# Patient Record
Sex: Male | Born: 1990 | Race: Black or African American | Hispanic: No | Marital: Single | Smoking: Never smoker
Health system: Southern US, Community
[De-identification: ages and names within clinical notes are randomized; demographics above are authoritative.]

---

## 2013-11-14 ENCOUNTER — Ambulatory Visit: Payer: Managed Care, Other (non HMO)

## 2013-11-14 ENCOUNTER — Ambulatory Visit (INDEPENDENT_AMBULATORY_CARE_PROVIDER_SITE_OTHER): Payer: Managed Care, Other (non HMO) | Admitting: Family Medicine

## 2013-11-14 VITALS — BP 100/60 | HR 64 | Temp 98.4°F | Resp 16 | Ht 67.0 in | Wt 169.0 lb

## 2013-11-14 DIAGNOSIS — M545 Low back pain, unspecified: Secondary | ICD-10-CM

## 2013-11-14 DIAGNOSIS — M25579 Pain in unspecified ankle and joints of unspecified foot: Secondary | ICD-10-CM

## 2013-11-14 DIAGNOSIS — S93409A Sprain of unspecified ligament of unspecified ankle, initial encounter: Secondary | ICD-10-CM

## 2013-11-14 DIAGNOSIS — M25571 Pain in right ankle and joints of right foot: Secondary | ICD-10-CM

## 2013-11-14 DIAGNOSIS — S39012A Strain of muscle, fascia and tendon of lower back, initial encounter: Secondary | ICD-10-CM

## 2013-11-14 DIAGNOSIS — S93401A Sprain of unspecified ligament of right ankle, initial encounter: Secondary | ICD-10-CM

## 2013-11-14 DIAGNOSIS — S335XXA Sprain of ligaments of lumbar spine, initial encounter: Secondary | ICD-10-CM

## 2013-11-14 NOTE — Progress Notes (Addendum)
Subjective:   This chart was scribed for Kenneth ChickKristi M Smith, MD by Arlan OrganAshley Leger, [facility] Sc[facility]. This patient was seen in room 1 and the patient's care was started 5:50 PM.     Patient ID: Kenneth Ruiz, male    DOB: [birth year], 23 y.o.   MRN: [id number]  11/14/2013  Ankle Pain and Back Pain   HPI  HPI Comments: Kenneth MaidensKeith Study Ruiz is a 23 y.o. male who presents to [facility] complaining of constant intermittent lateral right ankle pain x 1 day. Pt states he rolled his ankle while walking today. After injury, pt states he wrapped his ankle and elevated with ice. At this time he denies any numbness or tingling of the lower extremity. He reports an injury to the same ankle about 1 year ago. Denies any known allergies at this time.  Has had constant intermittent ankle pain for the past year.  He also reports intermittent back spasms that is intense enough to stop flexion of the back. He states this is not a new problem, and states it started about 1 year ago. He reports 5 episodes of pain since onset. States he stretched about 2-5 times a week to prevent pain.  Review of Systems  Constitutional: Negative for fever, chills, diaphoresis and fatigue.  HENT: Negative for congestion.   Eyes: Negative for redness.  Respiratory: Negative for cough.   Gastrointestinal: Negative for nausea and vomiting.  Genitourinary: Negative for dysuria, urgency, frequency and hematuria.  Musculoskeletal: Positive for myalgias, back pain, joint swelling, arthralgias and gait problem.  Skin: Negative for rash.  Neurological: Negative for weakness and numbness.  Psychiatric/Behavioral: Negative for confusion.    History reviewed. No pertinent past medical history. No Known Allergies No current outpatient prescriptions on file.   No current facility-administered medications for this visit.       Objective:    BP 100/60 mmHg  Pulse 64  Temp(Src) 98.4 F (36.9  C) (Oral)  Resp 16  Ht 5\' 7"  (1.702 m)  Wt 169 lb (76.658 kg)  BMI 26.46 kg/m2  SpO2 97% Physical Exam  Constitutional: He is oriented to person, place, and time. He appears well-developed and well-nourished.  HENT:  Head: Normocephalic and atraumatic.  Eyes: EOM are normal.  Neck: Normal range of motion.  Cardiovascular: Normal rate.   Pulmonary/Chest: Effort normal.  Musculoskeletal: He exhibits tenderness.       Right ankle: He exhibits decreased range of motion. He exhibits no swelling, no ecchymosis, no deformity and normal pulse. Tenderness. No lateral malleolus, no medial malleolus, no AITFL, no CF ligament, no posterior TFL, no head of 5th metatarsal and no proximal fibula tenderness found. Achilles tendon normal.       Lumbar back: He exhibits pain and spasm. He exhibits normal range of motion, no tenderness and no bony tenderness.       Right foot: There is tenderness and bony tenderness. There is normal range of motion, no swelling, normal capillary refill, no crepitus, no deformity and no laceration.  Tenderness to palpation over proximal lateral foot No pain over 5th of 4th metatarsal  Neurological: He is alert and oriented to person, place, and time.  Skin: Skin is warm and dry.  Psychiatric: He has a normal mood and affect. His behavior is normal.  Nursing note and vitals reviewed.  No results found for this or any previous visit.  [facility] reading (PRIMARY) by  Dr. Katrinka BlazingSmith.  R ANKLE: NAD;  LUMBAR SPINE: NAD      Assessment & Plan:  Pain in right ankle - Plan: DG Ankle Complete Right  Low back pain - Plan: DG Lumbar Spine 2-3 Views  Sprain of right ankle  Lumbar strain   1. Pain/sprain R lateral ankle: New. Recommend rest, ice, elevation, scheduled NSAIDs for one week.   If no improvement in one week, RTC. 2.  Low back pain/strain: New. Recommend rest, heat, daily home exercises, frequent ambulation.  Recommend scheduled NSAID for two weeks. If no improvement in one  month, call for ortho referral.  No orders of the defined types were placed in this encounter.    No Follow-up on file.  I personally performed the services described in this documentation, which was scribed in my presence.  The recorded information has been reviewed and is accurate.  Nilda Simmer, M.D.  [facility] [address] [place], [place]  [address] [phone] phone [phone] fax

## 2013-11-14 NOTE — Patient Instructions (Signed)
Acute Ankle Sprain  with Phase I Rehab  An acute ankle sprain is a partial or complete tear in one or more of the ligaments of the ankle due to traumatic injury. The severity of the injury depends on both the the number of ligaments sprained and the grade of sprain. There are 3 grades of sprains.   · A grade 1 sprain is a mild sprain. There is a slight pull without obvious tearing. There is no loss of strength, and the muscle and ligament are the correct length.  · A grade 2 sprain is a moderate sprain. There is tearing of fibers within the substance of the ligament where it connects two bones or two cartilages. The length of the ligament is increased, and there is usually decreased strength.  · A grade 3 sprain is a complete rupture of the ligament and is uncommon.  In addition to the grade of sprain, there are three types of ankle sprains.   Lateral ankle sprains: This is a sprain of one or more of the three ligaments on the outer side (lateral) of the ankle. These are the most common sprains.  Medial ankle sprains: There is one large triangular ligament of the inner side (medial) of the ankle that is susceptible to injury. Medial ankle sprains are less common.  Syndesmosis, "high ankle," sprains: The syndesmosis is the ligament that connects the two bones of the lower leg. Syndesmosis sprains usually only occur with very severe ankle sprains.  SYMPTOMS  · Pain, tenderness, and swelling in the ankle, starting at the side of injury that may progress to the whole ankle and foot with time.  · "Pop" or tearing sensation at the time of injury.  · Bruising that may spread to the heel.  · Impaired ability to walk soon after injury.  CAUSES   · Acute ankle sprains are caused by trauma placed on the ankle that temporarily forces or pries the anklebone (talus) out of its normal socket.  · Stretching or tearing of the ligaments that normally hold the joint in place (usually due to a twisting injury).  RISK INCREASES  WITH:  · Previous ankle sprain.  · Sports in which the foot may land awkwardly (ie. basketball, volleyball, or soccer) or walking or running on uneven or rough surfaces.  · Shoes with inadequate support to prevent sideways motion when stress occurs.  · Poor strength and flexibility.  · Poor balance skills.  · Contact sports.  PREVENTION   · Warm up and stretch properly before activity.  · Maintain physical fitness:  · Ankle and leg flexibility, muscle strength, and endurance.  · Cardiovascular fitness.  · Balance training activities.  · Use proper technique and have a coach correct improper technique.  · Taping, protective strapping, bracing, or high-top tennis shoes may help prevent injury. Initially, tape is best; however, it loses most of its support function within 10 to 15 minutes.  · Wear proper fitted protective shoes (High-top shoes with taping or bracing is more effective than either alone).  · Provide the ankle with support during sports and practice activities for 12 months following injury.  PROGNOSIS   · If treated properly, ankle sprains can be expected to recover completely; however, the length of recovery depends on the degree of injury.  · A grade 1 sprain usually heals enough in 5 to 7 days to allow modified activity and requires an average of 6 weeks to heal completely.  · A grade 2 sprain requires   6 to 10 weeks to heal completely.  · A grade 3 sprain requires 12 to 16 weeks to heal.  · A syndesmosis sprain often takes more than 3 months to heal.  RELATED COMPLICATIONS   · Frequent recurrence of symptoms may result in a chronic problem. Appropriately addressing the problem the first time decreases the frequency of recurrence and optimizes healing time. Severity of the initial sprain does not predict the likelihood of later instability.  · Injury to other structures (bone, cartilage, or tendon).  · A chronically unstable or arthritic ankle joint is a possiblity with repeated  sprains.  TREATMENT  Treatment initially involves the use of ice, medication, and compression bandages to help reduce pain and inflammation. Ankle sprains are usually immobilized in a walking cast or boot to allow for healing. Crutches may be recommended to reduce pressure on the injury. After immobilization, strengthening and stretching exercises may be necessary to regain strength and a full range of motion. Surgery is rarely needed to treat ankle sprains.  MEDICATION   · Nonsteroidal anti-inflammatory medications, such as aspirin and ibuprofen (do not take for the first 3 days after injury or within 7 days before surgery), or other minor pain relievers, such as acetaminophen, are often recommended. Take these as directed by your caregiver. Contact your caregiver immediately if any bleeding, stomach upset, or signs of an allergic reaction occur from these medications.  · Ointments applied to the skin may be helpful.  · Pain relievers may be prescribed as necessary by your caregiver. Do not take prescription pain medication for longer than 4 to 7 days. Use only as directed and only as much as you need.  HEAT AND COLD  · Cold treatment (icing) is used to relieve pain and reduce inflammation for acute and chronic cases. Cold should be applied for 10 to 15 minutes every 2 to 3 hours for inflammation and pain and immediately after any activity that aggravates your symptoms. Use ice packs or an ice massage.  · Heat treatment may be used before performing stretching and strengthening activities prescribed by your caregiver. Use a heat pack or a warm soak.  SEEK IMMEDIATE MEDICAL CARE IF:   · Pain, swelling, or bruising worsens despite treatment.  · You experience pain, numbness, discoloration, or coldness in the foot or toes.  · New, unexplained symptoms develop (drugs used in treatment may produce side effects.)  EXERCISES   PHASE I EXERCISES  RANGE OF MOTION (ROM) AND STRETCHING EXERCISES - Ankle Sprain, Acute Phase I,  Weeks 1 to 2  These exercises may help you when beginning to restore flexibility in your ankle. You will likely work on these exercises for the 1 to 2 weeks after your injury. Once your physician, physical therapist, or athletic trainer sees adequate progress, he or she will advance your exercises. While completing these exercises, remember:   · Restoring tissue flexibility helps normal motion to return to the joints. This allows healthier, less painful movement and activity.  · An effective stretch should be held for at least 30 seconds.  · A stretch should never be painful. You should only feel a gentle lengthening or release in the stretched tissue.  RANGE OF MOTION - Dorsi/Plantar Flexion  · While sitting with your right / left knee straight, draw the top of your foot upwards by flexing your ankle. Then reverse the motion, pointing your toes downward.  · Hold each position for __________ seconds.  · After completing your first set of   exercises, repeat this exercise with your knee bent.  Repeat __________ times. Complete this exercise __________ times per day.   RANGE OF MOTION - Ankle Alphabet  · Imagine your right / left big toe is a pen.  · Keeping your hip and knee still, write out the entire alphabet with your "pen." Make the letters as large as you can without increasing any discomfort.  Repeat __________ times. Complete this exercise __________ times per day.   STRENGTHENING EXERCISES - Ankle Sprain, Acute -Phase I, Weeks 1 to 2  These exercises may help you when beginning to restore strength in your ankle. You will likely work on these exercises for 1 to 2 weeks after your injury. Once your physician, physical therapist, or athletic trainer sees adequate progress, he or she will advance your exercises. While completing these exercises, remember:   · Muscles can gain both the endurance and the strength needed for everyday activities through controlled exercises.  · Complete these exercises as instructed by  your physician, physical therapist, or athletic trainer. Progress the resistance and repetitions only as guided.  · You may experience muscle soreness or fatigue, but the pain or discomfort you are trying to eliminate should never worsen during these exercises. If this pain does worsen, stop and make certain you are following the directions exactly. If the pain is still present after adjustments, discontinue the exercise until you can discuss the trouble with your clinician.  STRENGTH - Dorsiflexors  · Secure a rubber exercise band/tubing to a fixed object (ie. table, pole) and loop the other end around your right / left foot.  · Sit on the floor facing the fixed object. The band/tubing should be slightly tense when your foot is relaxed.  · Slowly draw your foot back toward you using your ankle and toes.  · Hold this position for __________ seconds. Slowly release the tension in the band and return your foot to the starting position.  Repeat __________ times. Complete this exercise __________ times per day.   STRENGTH - Plantar-flexors   · Sit with your right / left leg extended. Holding onto both ends of a rubber exercise band/tubing, loop it around the ball of your foot. Keep a slight tension in the band.  · Slowly push your toes away from you, pointing them downward.  · Hold this position for __________ seconds. Return slowly, controlling the tension in the band/tubing.  Repeat __________ times. Complete this exercise __________ times per day.   STRENGTH - Ankle Eversion  · Secure one end of a rubber exercise band/tubing to a fixed object (table, pole). Loop the other end around your foot just before your toes.  · Place your fists between your knees. This will focus your strengthening at your ankle.  · Drawing the band/tubing across your opposite foot, slowly, pull your little toe out and up. Make sure the band/tubing is positioned to resist the entire motion.  · Hold this position for __________ seconds.  Have  your muscles resist the band/tubing as it slowly pulls your foot back to the starting position.   Repeat __________ times. Complete this exercise __________ times per day.   STRENGTH - Ankle Inversion  · Secure one end of a rubber exercise band/tubing to a fixed object (table, pole). Loop the other end around your foot just before your toes.  · Place your fists between your knees. This will focus your strengthening at your ankle.  · Slowly, pull your big toe up and in, making   sure the band/tubing is positioned to resist the entire motion.  · Hold this position for __________ seconds.  · Have your muscles resist the band/tubing as it slowly pulls your foot back to the starting position.  Repeat __________ times. Complete this exercises __________ times per day.   STRENGTH - Towel Curls  · Sit in a chair positioned on a non-carpeted surface.  · Place your right / left foot on a towel, keeping your heel on the floor.  · Pull the towel toward your heel by only curling your toes. Keep your heel on the floor.  · If instructed by your physician, physical therapist, or athletic trainer, add weight to the end of the towel.  Repeat __________ times. Complete this exercise __________ times per day.  Document Released: 03/17/2005 Document Revised: 11/08/2011 Document Reviewed: 11/28/2008  [employer]® Patient Information ©2014 [employer].

## 2014-10-03 IMAGING — CR DG LUMBAR SPINE 2-3V
3 series · 3 of 3 positions shown · non-contrast
Comparison: None.

CLINICAL DATA: Pain.

EXAM:
LUMBAR SPINE - 2-3 VIEW

[AP]
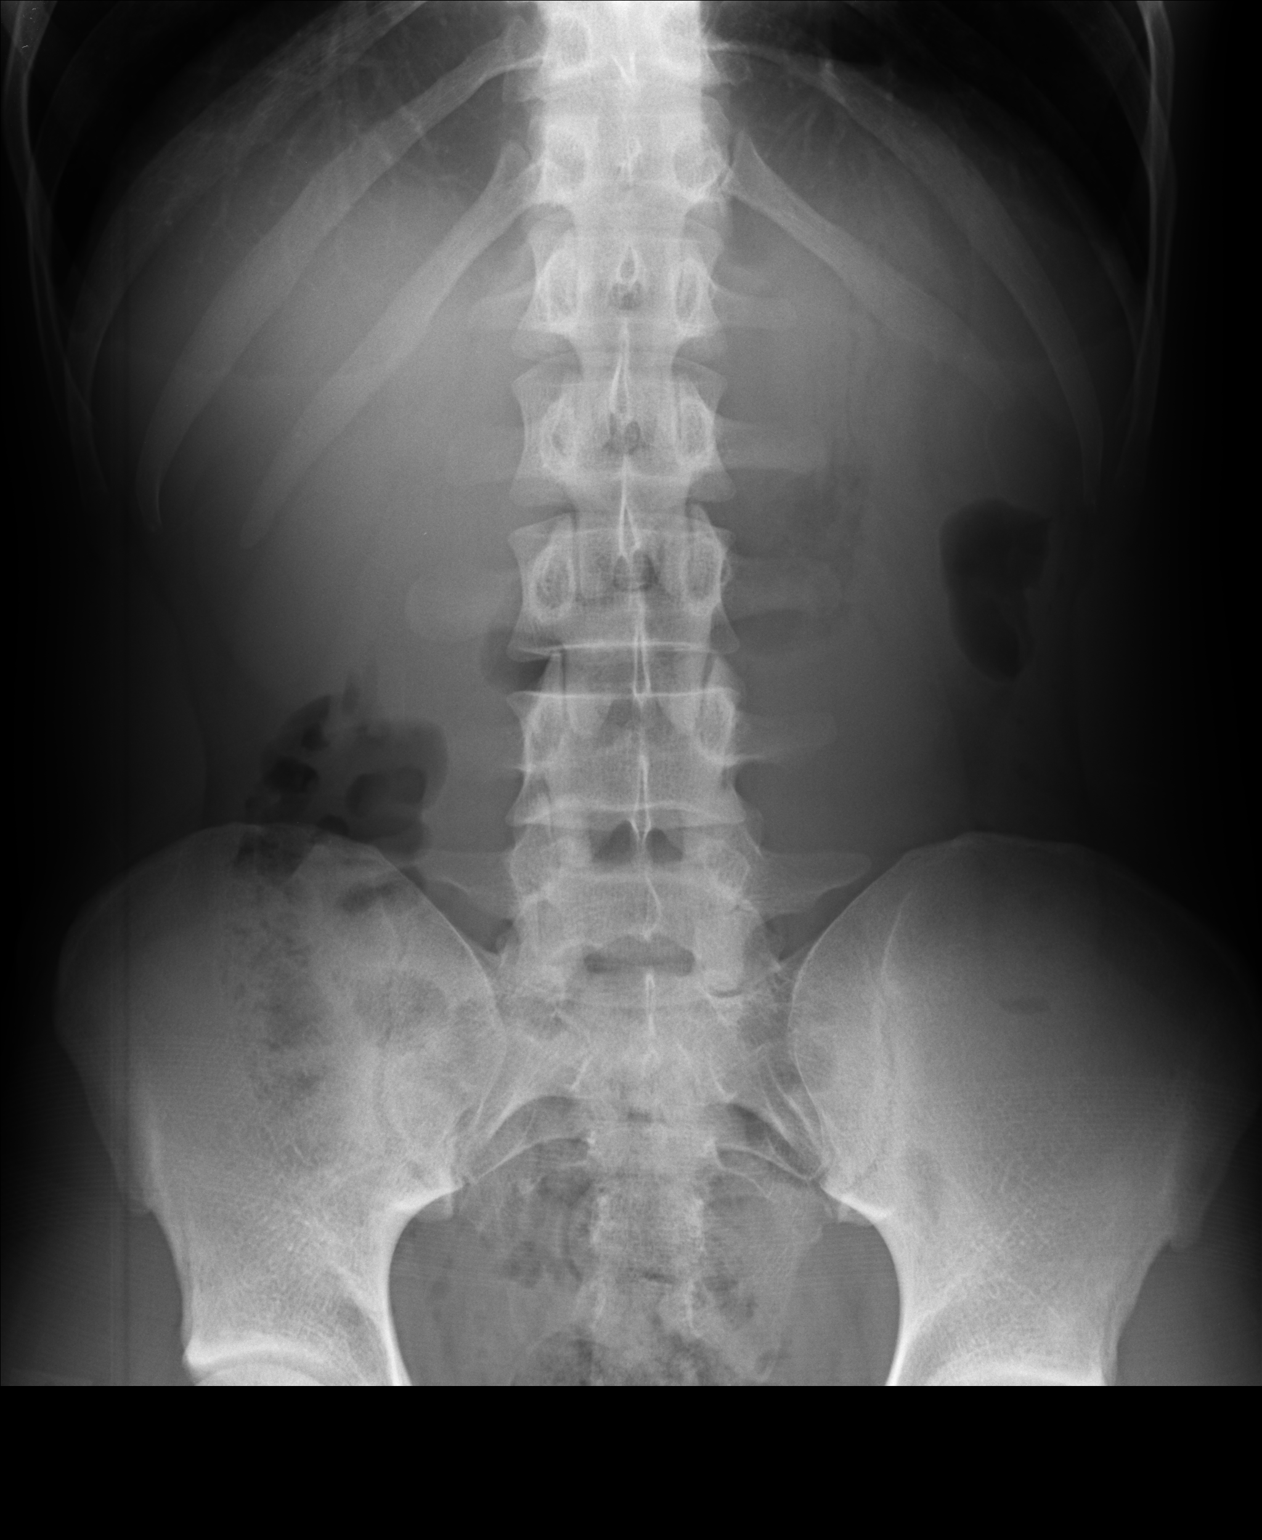

[lateral]
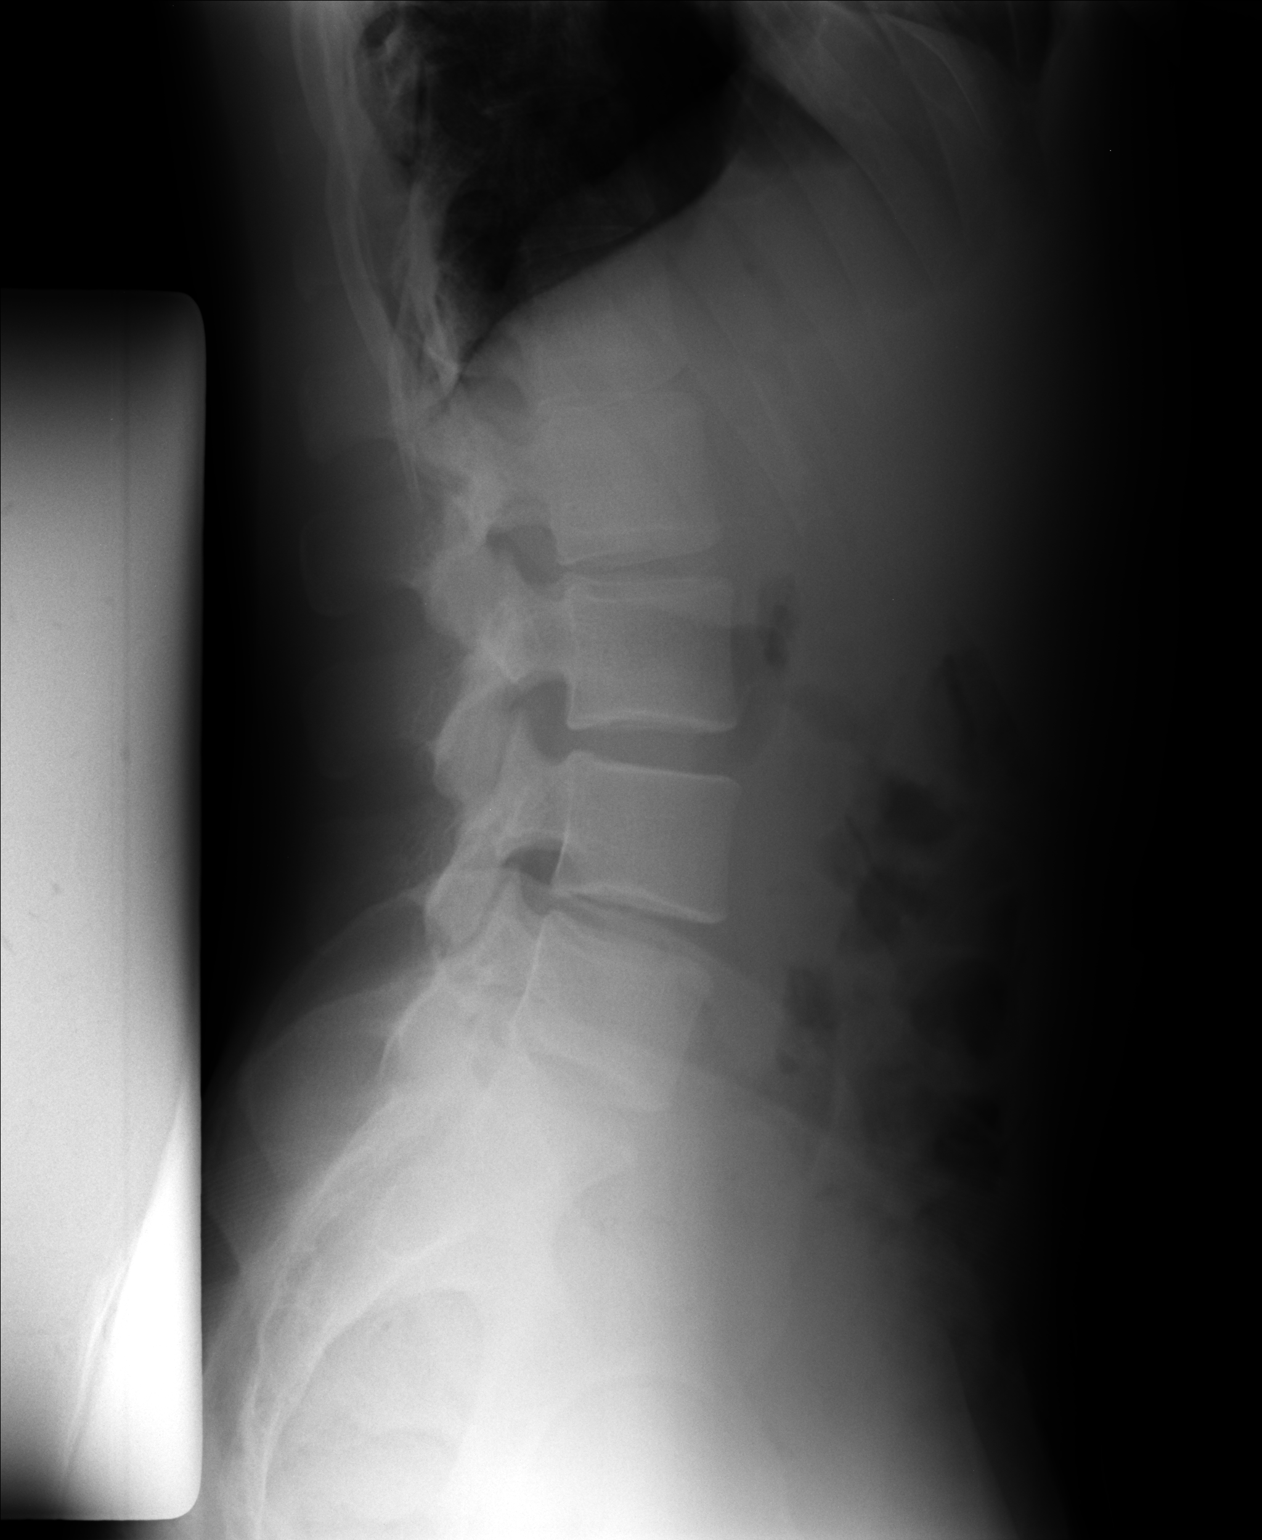

[l5 s1]
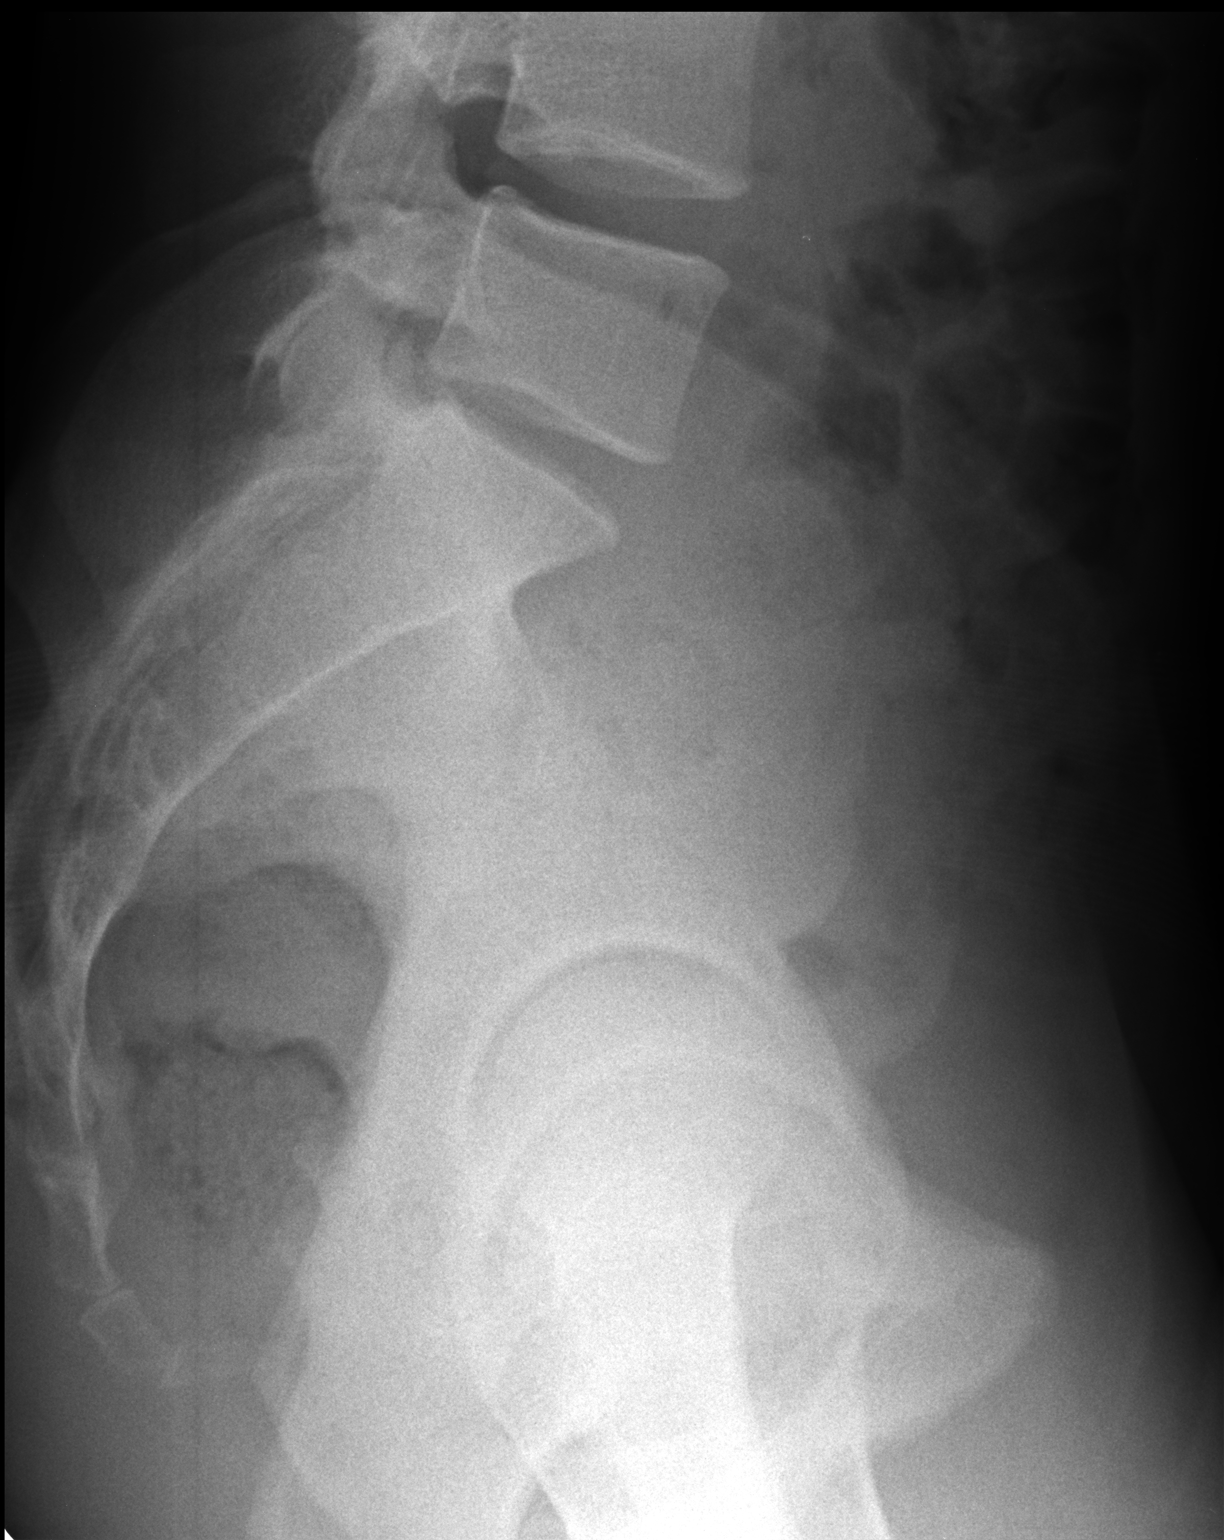

[3 of 3 positions shown; findings below may reference images not displayed]

FINDINGS: There is no evidence of lumbar spine fracture. Alignment is normal.
Intervertebral disc spaces are maintained.
IMPRESSION: Negative.

## 2014-10-03 IMAGING — CR DG ANKLE COMPLETE 3+V*R*
3 series · 3 of 3 positions shown · non-contrast
Comparison: None.

CLINICAL DATA: Pain.

EXAM:
RIGHT ANKLE - COMPLETE 3+ VIEW

[AP]
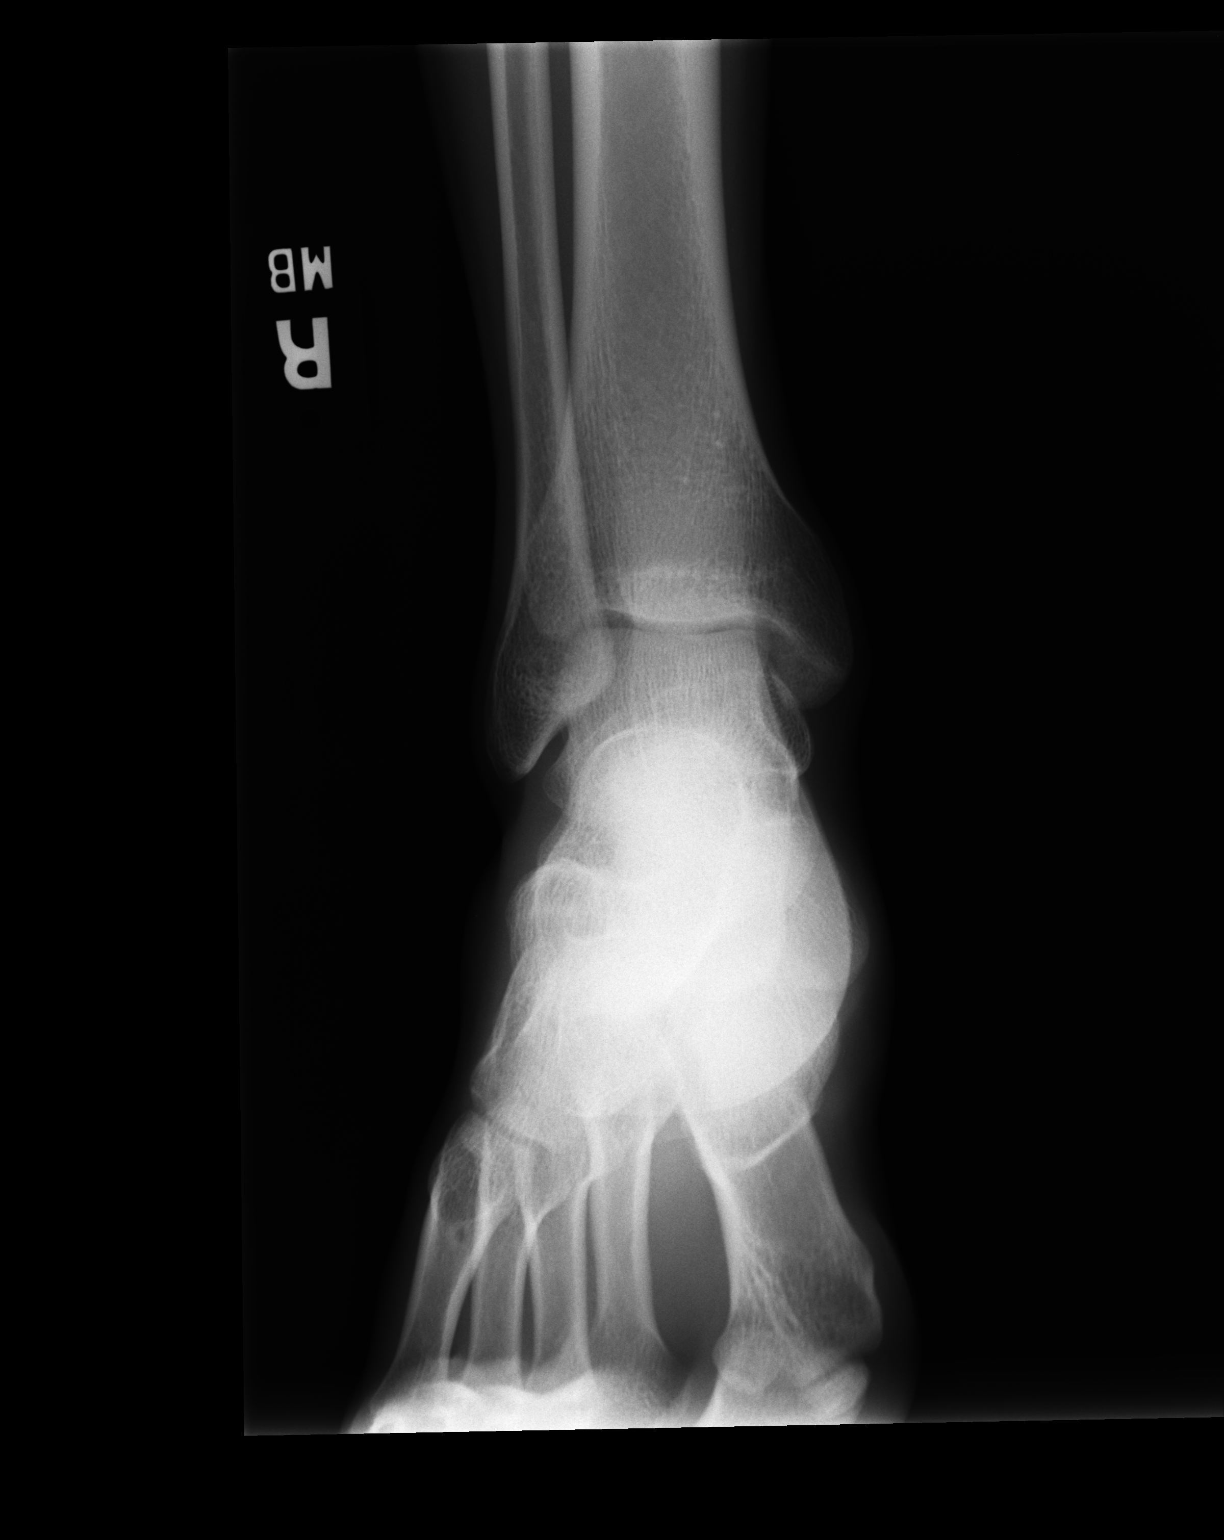

[ap obl int rot]
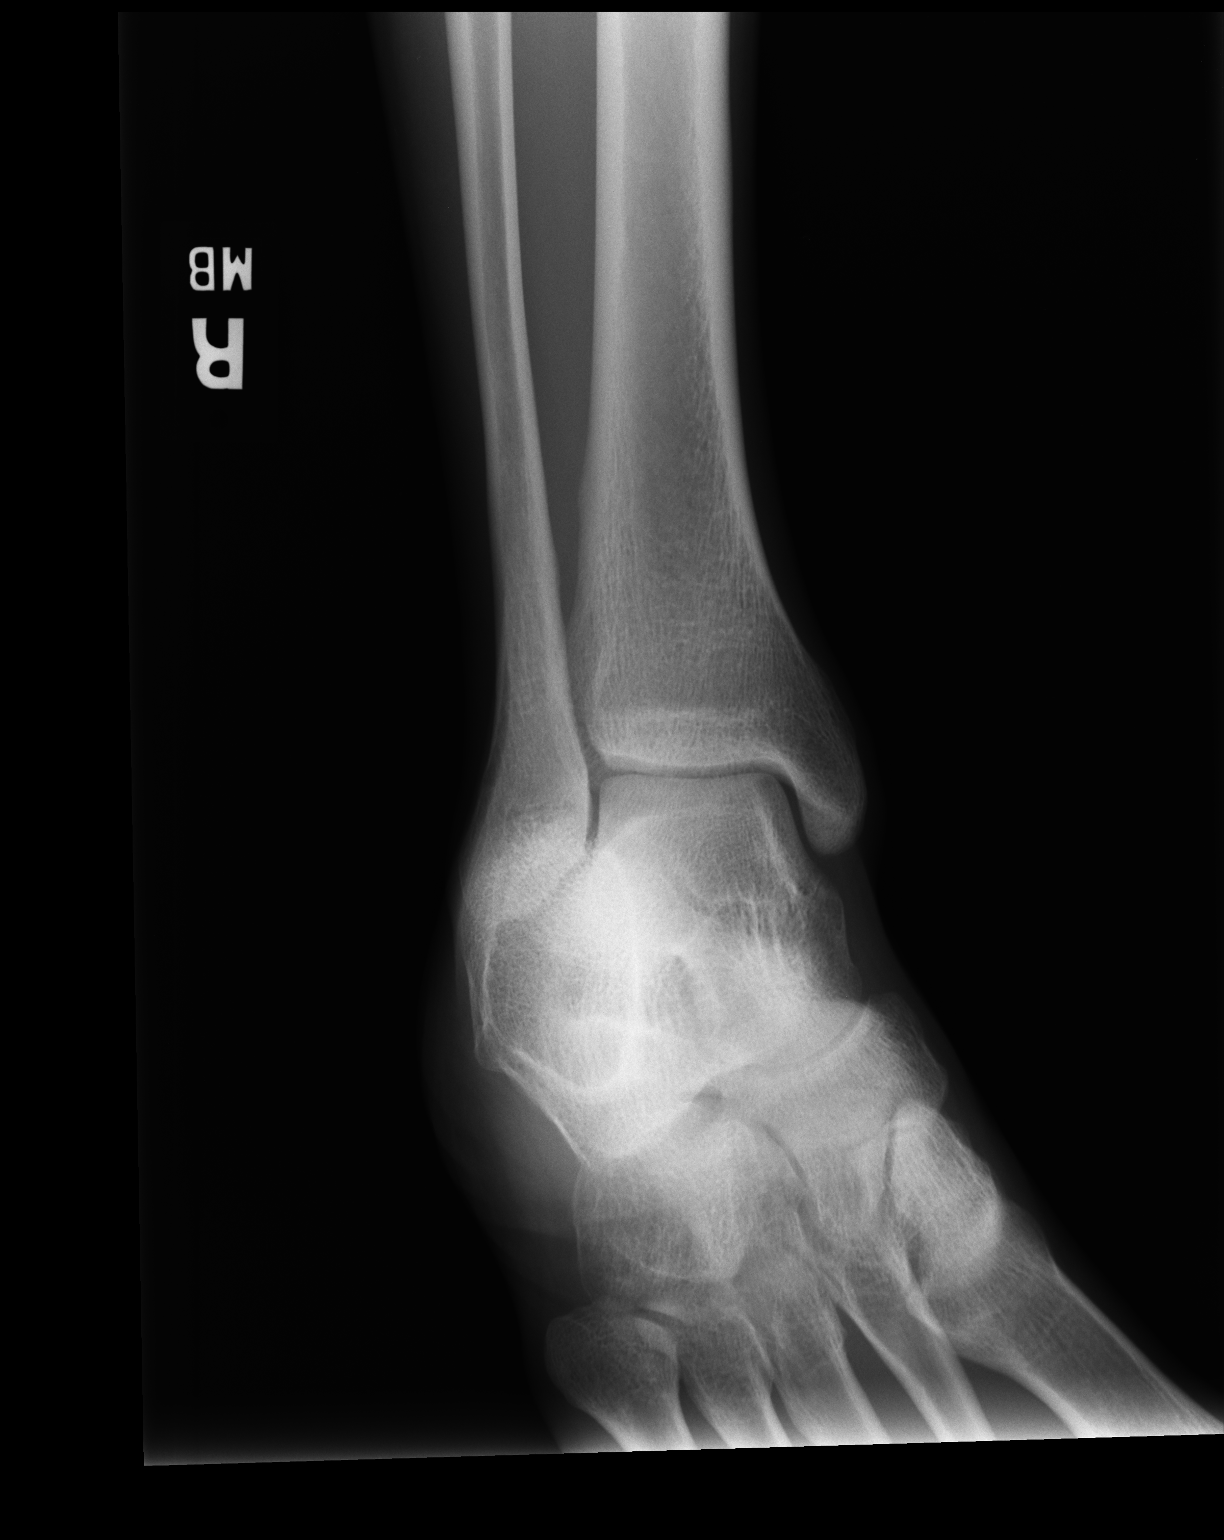

[lateral]
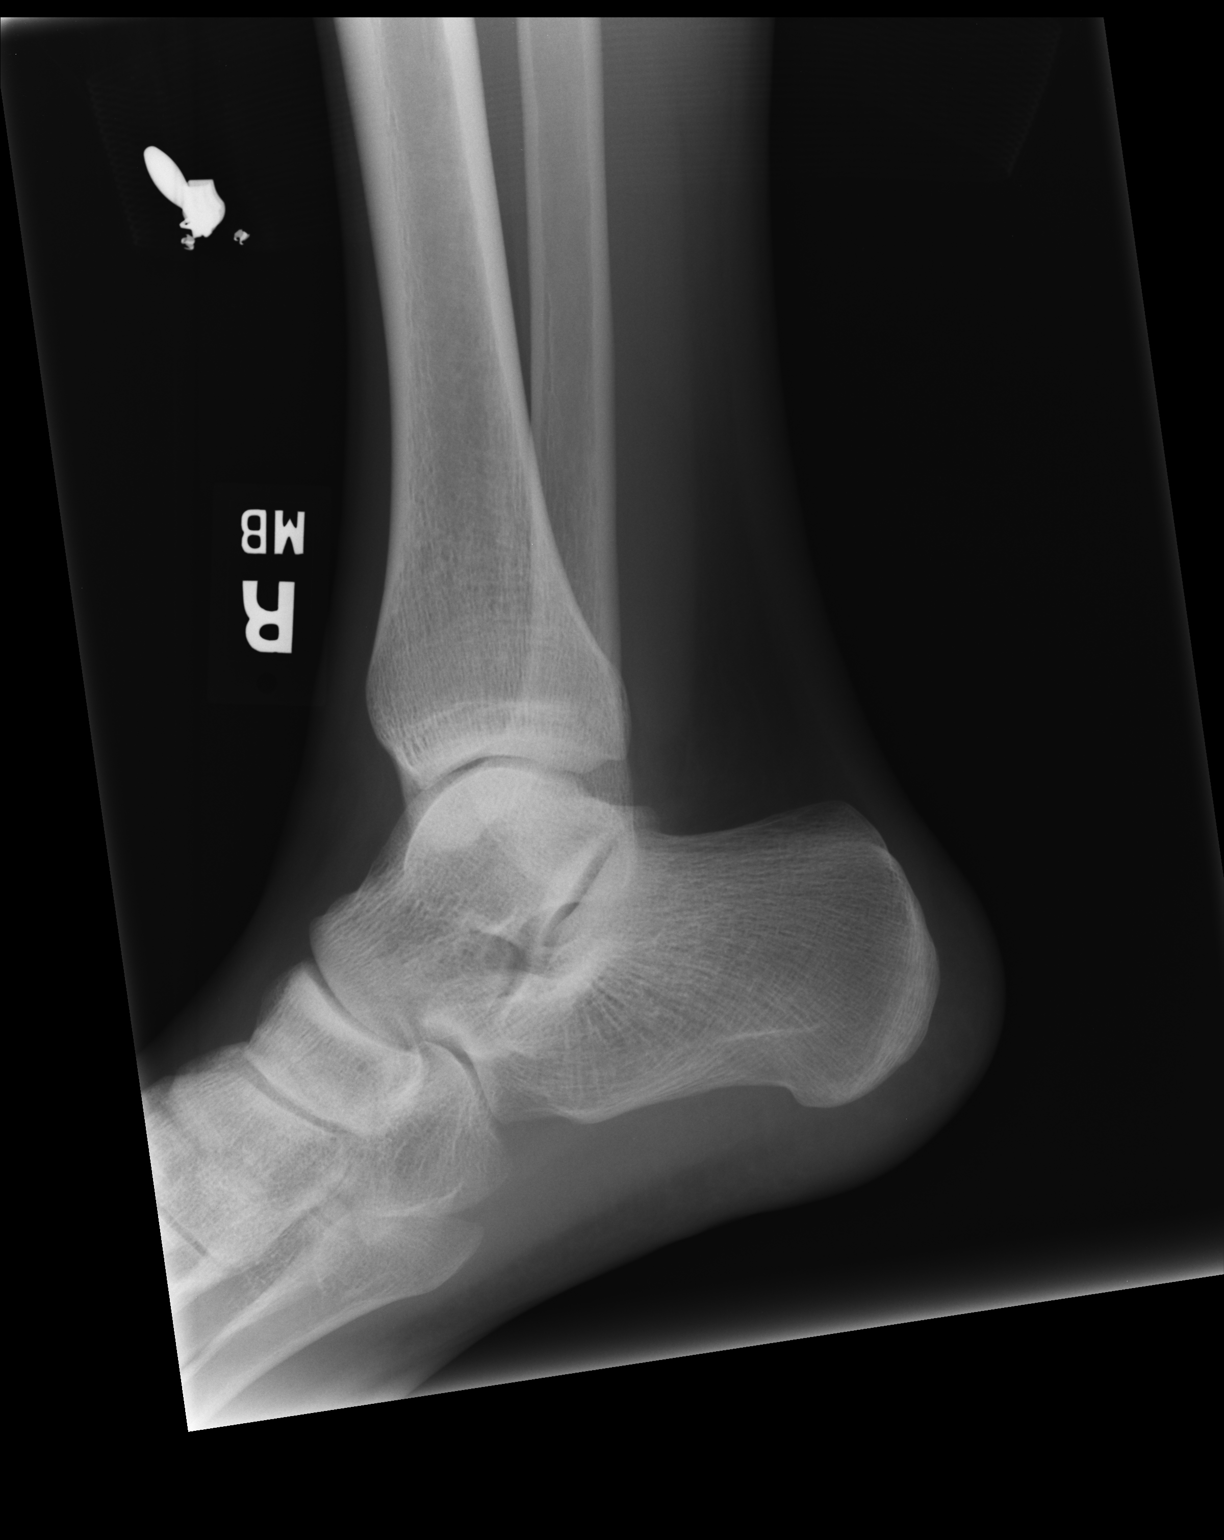

[3 of 3 positions shown; findings below may reference images not displayed]

FINDINGS: There is no evidence of fracture, dislocation, or joint effusion.
There is no evidence of arthropathy or other focal bone abnormality.
Soft tissues are unremarkable.
IMPRESSION: Negative.
